# Patient Record
Sex: Female | Born: 1990 | Race: Black or African American | Hispanic: No | Marital: Single | Smoking: Current every day smoker
Health system: Southern US, Community
[De-identification: ages and names within clinical notes are randomized; demographics above are authoritative.]

## PROBLEM LIST (undated history)

## (undated) ENCOUNTER — Emergency Department: Discharge status: Absent without leave | Payer: Self-pay

## (undated) HISTORY — PX: ARTHROSCOPIC REPAIR ACL: SUR80

## (undated) HISTORY — PX: NOSE SURGERY: SHX723

---
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
## 2009-11-05 ENCOUNTER — Emergency Department: Payer: Self-pay | Admitting: Emergency Medicine

## 2010-06-25 ENCOUNTER — Ambulatory Visit: Payer: Self-pay

## 2010-08-04 ENCOUNTER — Ambulatory Visit: Payer: Self-pay | Admitting: Unknown Physician Specialty

## 2011-10-24 ENCOUNTER — Ambulatory Visit: Payer: Self-pay | Admitting: Family Medicine

## 2012-03-06 ENCOUNTER — Emergency Department: Payer: Self-pay | Admitting: Internal Medicine

## 2012-03-06 LAB — CBC
HCT: 40 % (ref 35.0–47.0)
HGB: 12.8 g/dL (ref 12.0–16.0)
MCV: 100 fL (ref 80–100)
Platelet: 204 10*3/uL (ref 150–440)
RDW: 12.2 % (ref 11.5–14.5)
WBC: 5 10*3/uL (ref 3.6–11.0)

## 2012-03-06 LAB — COMPREHENSIVE METABOLIC PANEL
Albumin: 4.1 g/dL (ref 3.4–5.0)
Alkaline Phosphatase: 73 U/L (ref 50–136)
Anion Gap: 5 — ABNORMAL LOW (ref 7–16)
Calcium, Total: 9.2 mg/dL (ref 8.5–10.1)
Co2: 25 mmol/L (ref 21–32)
Creatinine: 0.73 mg/dL (ref 0.60–1.30)
EGFR (Non-African Amer.): >60
Glucose: 78 mg/dL (ref 65–99)
Osmolality: 279 (ref 275–301)
SGPT (ALT): 18 U/L (ref 12–78)
Sodium: 141 mmol/L (ref 136–145)
Total Protein: 7.9 g/dL (ref 6.4–8.2)

## 2012-03-06 LAB — URINALYSIS, COMPLETE
Bacteria: NONE SEEN
Bilirubin,UR: NEGATIVE
Glucose,UR: NEGATIVE mg/dL (ref 0–75)
Ketone: NEGATIVE
Leukocyte Esterase: NEGATIVE
Nitrite: NEGATIVE
Ph: 6 (ref 4.5–8.0)
RBC,UR: 1 /HPF (ref 0–5)
Squamous Epithelial: 6

## 2012-03-06 LAB — LIPASE, BLOOD: Lipase: 61 U/L — ABNORMAL LOW (ref 73–393)

## 2012-09-21 ENCOUNTER — Ambulatory Visit: Payer: Self-pay | Admitting: Unknown Physician Specialty

## 2013-06-16 ENCOUNTER — Inpatient Hospital Stay: Payer: Self-pay | Admitting: Psychiatry

## 2013-06-16 LAB — DRUG SCREEN, URINE
AMPHETAMINES, UR SCREEN: NEGATIVE (ref ?–1000)
Barbiturates, Ur Screen: NEGATIVE (ref ?–200)
Benzodiazepine, Ur Scrn: POSITIVE (ref ?–200)
CANNABINOID 50 NG, UR ~~LOC~~: POSITIVE (ref ?–50)
Cocaine Metabolite,Ur ~~LOC~~: NEGATIVE (ref ?–300)
MDMA (Ecstasy)Ur Screen: NEGATIVE (ref ?–500)
METHADONE, UR SCREEN: NEGATIVE (ref ?–300)
OPIATE, UR SCREEN: NEGATIVE (ref ?–300)
Phencyclidine (PCP) Ur S: NEGATIVE (ref ?–25)
TRICYCLIC, UR SCREEN: NEGATIVE (ref ?–1000)

## 2013-06-16 LAB — CBC
HCT: 39.8 % (ref 35.0–47.0)
HGB: 12.7 g/dL (ref 12.0–16.0)
MCH: 32.9 pg (ref 26.0–34.0)
MCHC: 31.8 g/dL — AB (ref 32.0–36.0)
MCV: 103 fL — ABNORMAL HIGH (ref 80–100)
Platelet: 182 10*3/uL (ref 150–440)
RBC: 3.85 10*6/uL (ref 3.80–5.20)
RDW: 12.5 % (ref 11.5–14.5)
WBC: 12.3 10*3/uL — ABNORMAL HIGH (ref 3.6–11.0)

## 2013-06-16 LAB — URINALYSIS, COMPLETE
BACTERIA: NONE SEEN
Bilirubin,UR: NEGATIVE
Glucose,UR: NEGATIVE mg/dL (ref 0–75)
KETONE: NEGATIVE
Leukocyte Esterase: NEGATIVE
Nitrite: NEGATIVE
Ph: 5 (ref 4.5–8.0)
Protein: 100
RBC,UR: 13 /HPF (ref 0–5)
SPECIFIC GRAVITY: 1.015 (ref 1.003–1.030)
Squamous Epithelial: 2
WBC UR: 19 /HPF (ref 0–5)

## 2013-06-16 LAB — COMPREHENSIVE METABOLIC PANEL
ALK PHOS: 57 U/L
ALT: 44 U/L (ref 12–78)
AST: 80 U/L — AB (ref 15–37)
Albumin: 4.1 g/dL (ref 3.4–5.0)
Anion Gap: 8 (ref 7–16)
BILIRUBIN TOTAL: 0.6 mg/dL (ref 0.2–1.0)
BUN: 11 mg/dL (ref 7–18)
CREATININE: 0.85 mg/dL (ref 0.60–1.30)
Calcium, Total: 9.2 mg/dL (ref 8.5–10.1)
Chloride: 110 mmol/L — ABNORMAL HIGH (ref 98–107)
Co2: 24 mmol/L (ref 21–32)
Glucose: 67 mg/dL (ref 65–99)
Osmolality: 281 (ref 275–301)
Potassium: 3.6 mmol/L (ref 3.5–5.1)
SODIUM: 142 mmol/L (ref 136–145)
Total Protein: 7.7 g/dL (ref 6.4–8.2)

## 2013-06-16 LAB — SALICYLATE LEVEL: SALICYLATES, SERUM: 4 mg/dL — AB

## 2013-06-16 LAB — PREGNANCY, URINE: Pregnancy Test, Urine: NEGATIVE m[IU]/mL

## 2013-06-16 LAB — TSH: Thyroid Stimulating Horm: 0.82 u[IU]/mL

## 2013-06-16 LAB — ACETAMINOPHEN LEVEL: Acetaminophen: <2 ug/mL

## 2013-06-16 LAB — ETHANOL
ETHANOL %: 0.086 % — AB (ref 0.000–0.080)
Ethanol: 86 mg/dL
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
## 2014-05-03 NOTE — Consult Note (Signed)
PATIENT NAME:  Jeanette Garcia, Jeanette Garcia MR#:  [id number] DATE OF BIRTH:  14-Dec-1990  AGE: 24 years  SEX:  Female  RACE:  African-American  DATE OF DICTATION: 06/16/2013  [facility]ACE [facility] DI[facility]  [facility], [facility] [place]  DATE OF CONSULTATION:  06/16/2013  CONSULTING PHYSICIAN:  Chicquita Mendel K. Tomio Kirk, MD  SUBJECTIVE:  The patient [facility]. 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 The patient is not married, and has a girlfriend for the past 3 years. They do not live together. The patient reports that she has conflicts with people around, and anxiety related to the same. She admits that she has been having conflicts with suicidal ideas and wanting to hurt herself, and felt that she needed help.  OBJECTIVE:  The patient is seen lying in bed. Alert and oriented, competent and cooperative. Good historian. Affect is flat. Mood restricted. Admits feeling hopeless and helpless. Admits to being depressed and low. Does have suicidal wishes and wishes to hurt herself. Currently she contracts for safety, but she feels that she does need help. No psychosis. Cognition is intact. Knowledge of general information is fair. Insight and judgment guarded. Impulse control is poor, as patient does have violent outbursts and she gets out of control at times because she gets frustrated, and does not know how to deal with her frustration.  IMPRESSION:  Major depression with suicidal ideas, but contracts for safety. Rule out gender identity disorder. Recommend inpatient hospital psychiatry for closer observation, evaluation, and help as needed.   ____________________________ Jannet MantisSurya K. Guss Bundehalla, MD skc:mr D: 06/16/2013 18:07:45 ET T: 06/16/2013 20:38:16 ET JOB#: [id number]  cc: Monika SalkSurya K. Guss Bundehalla, MD, <Dictator> Beau FannySURYA K Shruthi Northrup MD ELECTRONICALLY SIGNED 06/17/2013 7:03

## 2017-06-03 ENCOUNTER — Other Ambulatory Visit: Admission: AD | Admit: 2017-06-03 | Admitting: Family Medicine

## 2018-07-07 ENCOUNTER — Emergency Department

## 2018-07-07 ENCOUNTER — Encounter: Payer: Self-pay | Admitting: Emergency Medicine

## 2018-07-07 ENCOUNTER — Other Ambulatory Visit: Payer: Self-pay

## 2018-07-07 ENCOUNTER — Emergency Department
Admission: EM | Admit: 2018-07-07 | Discharge: 2018-07-08 | Disposition: A | Discharge status: Home / Self Care | Attending: Emergency Medicine | Admitting: Emergency Medicine

## 2018-07-07 DIAGNOSIS — S0083XA Contusion of other part of head, initial encounter: Secondary | ICD-10-CM

## 2018-07-07 DIAGNOSIS — Y999 Unspecified external cause status: Secondary | ICD-10-CM | POA: Insufficient documentation

## 2018-07-07 DIAGNOSIS — Y939 Activity, unspecified: Secondary | ICD-10-CM | POA: Insufficient documentation

## 2018-07-07 DIAGNOSIS — R600 Localized edema: Secondary | ICD-10-CM | POA: Insufficient documentation

## 2018-07-07 DIAGNOSIS — F17200 Nicotine dependence, unspecified, uncomplicated: Secondary | ICD-10-CM | POA: Insufficient documentation

## 2018-07-07 DIAGNOSIS — S022XXA Fracture of nasal bones, initial encounter for closed fracture: Secondary | ICD-10-CM | POA: Insufficient documentation

## 2018-07-07 DIAGNOSIS — Z23 Encounter for immunization: Secondary | ICD-10-CM | POA: Insufficient documentation

## 2018-07-07 DIAGNOSIS — F10920 Alcohol use, unspecified with intoxication, uncomplicated: Secondary | ICD-10-CM | POA: Insufficient documentation

## 2018-07-07 DIAGNOSIS — Y929 Unspecified place or not applicable: Secondary | ICD-10-CM | POA: Insufficient documentation

## 2018-07-07 NOTE — ED Notes (Signed)
Patient transported to CT 
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
## 2018-07-08 MED ORDER — TETANUS-DIPHTH-ACELL PERTUSSIS 5-2.5-18.5 LF-MCG/0.5 IM SUSP
0.5000 mL | Freq: Once | INTRAMUSCULAR | Status: AC
  Administered 2018-07-08: 03:00:00 0.5 mL via INTRAMUSCULAR
  Filled 2018-07-08: qty 0.5

## 2018-07-08 NOTE — Discharge Instructions (Addendum)
Your CT scan shows a broken bone in your nose. You should follow up with ENT in one week for further evaluation of this injury.
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
## 2018-07-08 NOTE — ED Notes (Signed)
No peripheral IV placed this visit.   Discharge instructions reviewed with patient. Questions fielded by this RN. Patient verbalizes understanding of instructions. Patient discharged home in stable condition per stafford. No acute distress noted at time of discharge.   Pt's cousin, Teena Dunk, called to pick up pt.  Pt threatening to smoke in room if not DC'd.  Charge aware.  Pt shown to lobby and telephone.

## 2018-07-08 NOTE — ED Notes (Signed)
Anselm Jungling "Tart", called, can pick up pt, [phone]

## 2018-07-09 ENCOUNTER — Other Ambulatory Visit: Payer: Self-pay

## 2018-07-09 ENCOUNTER — Ambulatory Visit (INDEPENDENT_AMBULATORY_CARE_PROVIDER_SITE_OTHER): Payer: Self-pay

## 2018-07-09 ENCOUNTER — Ambulatory Visit
Admission: EM | Admit: 2018-07-09 | Discharge: 2018-07-09 | Disposition: A | Discharge status: Home / Self Care | Payer: Self-pay | Attending: Emergency Medicine | Admitting: Emergency Medicine

## 2018-07-09 DIAGNOSIS — M79671 Pain in right foot: Secondary | ICD-10-CM

## 2018-07-09 DIAGNOSIS — S0993XA Unspecified injury of face, initial encounter: Secondary | ICD-10-CM

## 2018-07-09 DIAGNOSIS — S0083XA Contusion of other part of head, initial encounter: Secondary | ICD-10-CM

## 2018-07-09 DIAGNOSIS — S022XXD Fracture of nasal bones, subsequent encounter for fracture with routine healing: Secondary | ICD-10-CM

## 2018-07-09 DIAGNOSIS — S9031XA Contusion of right foot, initial encounter: Secondary | ICD-10-CM

## 2018-07-09 DIAGNOSIS — S0992XA Unspecified injury of nose, initial encounter: Secondary | ICD-10-CM
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
## 2018-07-09 NOTE — Discharge Instructions (Signed)
Over-the-counter ibuprofen or Tylenol as needed.  Ice.  Rest.  Supportive care and monitoring.  Follow-up with ENT in 1 week as directed for follow-up.  Limit alcohol intake.  Follow up with your primary care physician this week as needed. Return to Urgent care for new or worsening concerns.
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
## 2018-08-06 ENCOUNTER — Other Ambulatory Visit: Payer: Self-pay

## 2018-08-06 ENCOUNTER — Emergency Department
Admission: EM | Admit: 2018-08-06 | Discharge: 2018-08-07 | Disposition: A | Discharge status: Home / Self Care | Payer: Self-pay | Attending: Emergency Medicine | Admitting: Emergency Medicine

## 2018-08-06 ENCOUNTER — Encounter: Payer: Self-pay | Admitting: Emergency Medicine

## 2018-08-06 DIAGNOSIS — F1094 Alcohol use, unspecified with alcohol-induced mood disorder: Secondary | ICD-10-CM | POA: Diagnosis present

## 2018-08-06 DIAGNOSIS — R451 Restlessness and agitation: Secondary | ICD-10-CM

## 2018-08-06 DIAGNOSIS — R45851 Suicidal ideations: Secondary | ICD-10-CM

## 2018-08-06 DIAGNOSIS — F419 Anxiety disorder, unspecified: Secondary | ICD-10-CM | POA: Insufficient documentation

## 2018-08-06 DIAGNOSIS — Z20828 Contact with and (suspected) exposure to other viral communicable diseases: Secondary | ICD-10-CM | POA: Insufficient documentation

## 2018-08-06 DIAGNOSIS — F10920 Alcohol use, unspecified with intoxication, uncomplicated: Secondary | ICD-10-CM

## 2018-08-06 DIAGNOSIS — F17210 Nicotine dependence, cigarettes, uncomplicated: Secondary | ICD-10-CM | POA: Insufficient documentation

## 2018-08-06 LAB — CBC WITH DIFFERENTIAL/PLATELET
Abs Immature Granulocytes: 0.01 10*3/uL (ref 0.00–0.07)
Basophils Absolute: 0 10*3/uL (ref 0.0–0.1)
Basophils Relative: 1 %
Eosinophils Absolute: 0 10*3/uL (ref 0.0–0.5)
Eosinophils Relative: 1 %
HCT: 41.7 % (ref 36.0–46.0)
Hemoglobin: 13.5 g/dL (ref 12.0–15.0)
Immature Granulocytes: 0 %
Lymphocytes Relative: 28 %
Lymphs Abs: 1.7 10*3/uL (ref 0.7–4.0)
MCH: 33.3 pg (ref 26.0–34.0)
MCHC: 32.4 g/dL (ref 30.0–36.0)
MCV: 103 fL — ABNORMAL HIGH (ref 80.0–100.0)
Monocytes Absolute: 0.3 10*3/uL (ref 0.1–1.0)
Monocytes Relative: 5 %
Neutro Abs: 3.8 10*3/uL (ref 1.7–7.7)
Neutrophils Relative %: 65 %
Platelets: 237 10*3/uL (ref 150–400)
RBC: 4.05 MIL/uL (ref 3.87–5.11)
RDW: 11.7 % (ref 11.5–15.5)
WBC: 5.9 10*3/uL (ref 4.0–10.5)
nRBC: 0 % (ref 0.0–0.2)

## 2018-08-06 LAB — ETHANOL: Alcohol, Ethyl (B): 197 mg/dL — ABNORMAL HIGH (ref <10)

## 2018-08-06 LAB — COMPREHENSIVE METABOLIC PANEL
ALT: 19 U/L (ref 0–44)
AST: 26 U/L (ref 15–41)
Albumin: 4.3 g/dL (ref 3.5–5.0)
Alkaline Phosphatase: 65 U/L (ref 38–126)
Anion gap: 12 (ref 5–15)
BUN: 10 mg/dL (ref 6–20)
CO2: 23 mmol/L (ref 22–32)
Calcium: 9.2 mg/dL (ref 8.9–10.3)
Chloride: 110 mmol/L (ref 98–111)
Creatinine, Ser: 0.76 mg/dL (ref 0.44–1.00)
GFR calc Af Amer: >60 mL/min (ref >60)
GFR calc non Af Amer: >60 mL/min (ref >60)
Glucose, Bld: 88 mg/dL (ref 70–99)
Potassium: 3.4 mmol/L — ABNORMAL LOW (ref 3.5–5.1)
Sodium: 145 mmol/L (ref 135–145)
Total Bilirubin: 0.6 mg/dL (ref 0.3–1.2)
Total Protein: 7.9 g/dL (ref 6.5–8.1)

## 2018-08-06 LAB — ACETAMINOPHEN LEVEL: Acetaminophen (Tylenol), Serum: <10 ug/mL — ABNORMAL LOW (ref 10–30)

## 2018-08-06 LAB — SALICYLATE LEVEL: Salicylate Lvl: <7 mg/dL (ref 2.8–30.0)

## 2018-08-06 MED ORDER — LORAZEPAM 2 MG PO TABS
2.0000 mg | ORAL_TABLET | Freq: Once | ORAL | Status: AC
  Administered 2018-08-06: 17:00:00 2 mg via ORAL
  Filled 2018-08-06: qty 1

## 2018-08-06 NOTE — ED Notes (Signed)
Patient currently laying in bed resting and appears to be asleep. Respirations even and non labored. Will continue to monitor.

## 2018-08-06 NOTE — ED Notes (Signed)
PT agitated, stating "I'm leaving" this RN informed pt of IVC paperwork and pt states " I don't care I'm about leave"

## 2018-08-06 NOTE — ED Notes (Signed)
Pt given sanitary pads for menstrual cycle per request
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
## 2018-08-06 NOTE — ED Notes (Signed)
Psych at bedside.
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
## 2018-08-06 NOTE — ED Notes (Signed)
IVC  SEEN  BY  NP  PENDING  PLACEMENT

## 2018-08-06 NOTE — ED Notes (Signed)
IVC PENDING  CONSULT ?

## 2018-08-06 NOTE — ED Notes (Signed)
Pt asking for medication for her agitation and since " I have to be here longer "

## 2018-08-06 NOTE — ED Notes (Signed)
Pt given dinner tray, does not want to eat at this time

## 2018-08-06 NOTE — ED Triage Notes (Signed)
Pt belongings: 1 yellow colored ring 2 yellow colored bracelets 1 yellow colored chain necklace  1 yellow colored necklace with number 3 and cross on it Pair of black flip flops Army green camouflage shorts  Brown belt Black tank top   Belonging bagged, tagged and given to [employer] for transport to pt ED room
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
## 2018-08-06 NOTE — ED Notes (Signed)
Pt was given the phone to use, pt made comments such as "security can't stop me," and "Im leaving."
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
## 2018-08-06 NOTE — ED Notes (Signed)
Pt using phone at this time.  

## 2018-08-07 DIAGNOSIS — F1094 Alcohol use, unspecified with alcohol-induced mood disorder: Secondary | ICD-10-CM | POA: Diagnosis present

## 2018-08-07 LAB — URINALYSIS, COMPLETE (UACMP) WITH MICROSCOPIC
Bacteria, UA: NONE SEEN
Bilirubin Urine: NEGATIVE
Glucose, UA: NEGATIVE mg/dL
Ketones, ur: 5 mg/dL — AB
Nitrite: NEGATIVE
Protein, ur: 30 mg/dL — AB
RBC / HPF: >50 RBC/hpf — ABNORMAL HIGH (ref 0–5)
Specific Gravity, Urine: 1.023 (ref 1.005–1.030)
pH: 8 (ref 5.0–8.0)

## 2018-08-07 LAB — URINE DRUG SCREEN, QUALITATIVE (ARMC ONLY)
Amphetamines, Ur Screen: NOT DETECTED
Barbiturates, Ur Screen: NOT DETECTED
Benzodiazepine, Ur Scrn: POSITIVE — AB
Cannabinoid 50 Ng, Ur ~~LOC~~: POSITIVE — AB
Cocaine Metabolite,Ur ~~LOC~~: NOT DETECTED
MDMA (Ecstasy)Ur Screen: NOT DETECTED
Methadone Scn, Ur: NOT DETECTED
Opiate, Ur Screen: NOT DETECTED
Phencyclidine (PCP) Ur S: NOT DETECTED
Tricyclic, Ur Screen: NOT DETECTED

## 2018-08-07 LAB — NOVEL CORONAVIRUS, NAA (HOSP ORDER, SEND-OUT TO REF LAB; TAT 18-24 HRS): SARS-CoV-2, NAA: NOT DETECTED

## 2018-08-07 LAB — PREGNANCY, URINE: Preg Test, Ur: NEGATIVE

## 2018-08-07 MED ORDER — NICOTINE 21 MG/24HR TD PT24
21.0000 mg | MEDICATED_PATCH | Freq: Once | TRANSDERMAL | Status: DC
  Administered 2018-08-07: 09:00:00 21 mg via TRANSDERMAL
  Filled 2018-08-07: qty 1

## 2018-08-07 MED ORDER — HYDROXYZINE HCL 25 MG PO TABS
50.0000 mg | ORAL_TABLET | Freq: Once | ORAL | Status: AC
  Administered 2018-08-07: 08:00:00 50 mg via ORAL
  Filled 2018-08-07: qty 2

## 2018-08-07 NOTE — ED Notes (Signed)
Pt has visitor at bedside. Screened for COVID at first nurse desk. Wanded in by Garment/textile technologist. Chaperoned by Solmon Ice, NT. Visit limited for 15 mins.

## 2018-08-07 NOTE — ED Notes (Signed)
Pt. Transferred to BHU from ED to room 2 after screening for contraband. Report to include Situation, Background, Assessment and Recommendations from Kim RN. Pt. Oriented to unit including Q15 minute rounds as well as the security cameras for their protection. Patient is alert and oriented, warm and dry in no acute distress. Patient denies SI, HI, and AVH. Pt. Encouraged to let me know if needs arise.  

## 2018-08-07 NOTE — ED Provider Notes (Signed)
The patient has been evaluated at bedside by Dr. Weber Cooks, psychiatry.  Patient is clinically stable.  Not felt to be a danger to self or others.  No SI or Hi.  No indication for inpatient psychiatric admission at this time.  Appropriate for continued outpatient therapy.     Merlyn Lot, MD 08/07/18 [phone]

## 2018-08-07 NOTE — ED Notes (Signed)
Hourly rounding reveals patient sleeping in room. No complaints, stable, in no acute distress. Q15 minute rounds and monitoring via Security Cameras to continue. 

## 2018-08-07 NOTE — ED Notes (Signed)
Meal tray given 

## 2018-08-07 NOTE — ED Notes (Addendum)
Father notifying sister, Caryl Pina, that patient is ready for pick up

## 2018-08-07 NOTE — ED Notes (Signed)
Pt up to restroom and was in there for around 30 mins and did not give a urine sample.

## 2018-08-07 NOTE — Consult Note (Signed)
[facility] Face-to-Face Psychiatry Consult   Reason for Consult:  Suicidal ideations while intoxicated Referring Physician:  EDP Patient Identification: Jeanette Garcia MRN:  [id number] Principal Diagnosis: Alcohol-induced mood disorder ([place]) Diagnosis:  Principal Problem:   Alcohol-induced mood disorder ([place])   Total Time spent with patient: 30 minutes  Subjective:   Jeanette Garcia is a 28 y.o. female patient reports that she was intoxicated yesterday when the text was sent to her father.  Patient states that she does not want to kill herself and she has no suicidal or homicidal ideations and denies any hallucinations.  Patient reports that she will be safe to discharge home and that her girlfriend, Wilber Bihari, will be staying with her when she leaves.  Contacted patient's father, Joneen Boers, was contacted for collateral information.  Patient's father states that he received a text and became concerned about her and sent the police over there to her house.  He states that now that she has sobered up he feels that she is safe to discharge home and that he is aware that her girlfriend is going to be staying with her and he feels safe with her staying with her girlfriend.  He states that he has no concerns at this time about the patient being discharged.  Contacted patient's girlfriend, [place].  Patient's girlfriend reports that yes she will be staying with her and feels that the patient only had the actions that she did yesterday due to her being intoxicated.  She states that she will be staying with her and that the patient's sister, Caryl Pina, will be picking her up from the hospital.  She states that she has no concerns with the patient being discharged and feels that she is safe for discharge.  Patient's girlfriend stated that she also visited her last night and feels that she has shown improvement with sobering up.  HPI:  Per Dr. Cinda Quest: 28 y.o.femalewho comes in under commitment. Commitment papers says she  is a known drug user who told her stepfather she was gone to kill herself. Police were called they found her with a belt around her neck. Patient says she is feeling fine and wants to go home. Patient also says some members of her family have a coronavirus and she wants to be tested for it.  Patient was seen by this provider face-to-face.  Patient presents much more calm, cooperative, and pleasant today.  Patient has remained in the [facility] without any incident.  There have been no complaints about the patient while she has been in [place].  Patient has continued to deny any suicidal homicidal ideations and is shown no aggressive behavior and does not appear to be responding to any internal or external stimuli.  When speaking with patient she does have logical conversation.  Based on collateral information gained patient is safe for discharge and there is a safety plan established.  IVC will be rescinded by Dr. Weber Cooks.  I have contacted Dr. Quentin Cornwall about this information.  At this time the patient does not meet inpatient criteria and is psychiatric cleared.  Past Psychiatric History: None  Risk to Self: Suicidal Ideation: No Suicidal Intent: No Is patient at risk for suicide?: No Suicidal Plan?: No Access to Means: No What has been your use of drugs/alcohol within the last 12 months?: UTA How many times?: 0 Other Self Harm Risks: None Reported Triggers for Past Attempts: None known Intentional Self Injurious Behavior: None Risk to Others: Homicidal Ideation: No Thoughts of Harm to Others: No  Current Homicidal Intent: No Current Homicidal Plan: No Access to Homicidal Means: No Identified Victim: None History of harm to others?: No Assessment of Violence: None Noted Violent Behavior Description: None Does patient have access to weapons?: No Criminal Charges Pending?: No Does patient have a court date: No Prior Inpatient Therapy: Prior Inpatient Therapy: No Prior Outpatient Therapy: Prior  Outpatient Therapy: No Does patient have an ACCT team?: No Does patient have Intensive In-House Services?  : No Does patient have [facility] services? : No Does patient have P4CC services?: No  Past Medical History: History reviewed. No pertinent past medical history.  Past Surgical History:  Procedure Laterality Date  . ARTHROSCOPIC REPAIR ACL Right   . NOSE SURGERY     Family History: No family history on file. Family Psychiatric  History: None reported Social History:  Social History   Substance and Sexual Activity  Alcohol Use Yes   Comment: occasionally     Social History   Substance and Sexual Activity  Drug Use Not Currently    Social History   Socioeconomic History  . Marital status: Single    Spouse name: Not on file  . Number of children: Not on file  . Years of education: Not on file  . Highest education level: Not on file  Occupational History  . Not on file  Social Needs  . Financial resource strain: Not on file  . Food insecurity    Worry: Not on file    Inability: Not on file  . Transportation needs    Medical: Not on file    Non-medical: Not on file  Tobacco Use  . Smoking status: Current Every Day Smoker    Packs/day: 0.50    Types: Cigarettes  . Smokeless tobacco: Never Used  Substance and Sexual Activity  . Alcohol use: Yes    Comment: occasionally  . Drug use: Not Currently  . Sexual activity: Not on file  Lifestyle  . Physical activity    Days per week: Not on file    Minutes per session: Not on file  . Stress: Not on file  Relationships  . Social Musicianconnections    Talks on phone: Not on file    Gets together: Not on file    Attends religious service: Not on file    Active member of club or organization: Not on file    Attends meetings of clubs or organizations: Not on file    Relationship status: Not on file  Other Topics Concern  . Not on file  Social History Narrative  . Not on file   Additional Social History:    Allergies:   No Known Allergies  Labs:  Results for orders placed or performed during the hospital encounter of 08/06/18 (from the past 48 hour(s))  Comprehensive metabolic panel     Status: Abnormal   Collection Time: 08/06/18  1:51 PM  Result Value Ref Range   Sodium 145 135 - 145 mmol/L   Potassium 3.4 (L) 3.5 - 5.1 mmol/L   Chloride 110 98 - 111 mmol/L   CO2 23 22 - 32 mmol/L   Glucose, Bld 88 70 - 99 mg/dL   BUN 10 6 - 20 mg/dL   Creatinine, Ser 1.610.76 0.44 - 1.00 mg/dL   Calcium 9.2 8.9 - 09.610.3 mg/dL   Total Protein 7.9 6.5 - 8.1 g/dL   Albumin 4.3 3.5 - 5.0 g/dL   AST 26 15 - 41 U/L   ALT 19 0 - 44  U/L   Alkaline Phosphatase 65 38 - 126 U/L   Total Bilirubin 0.6 0.3 - 1.2 mg/dL   GFR calc non Af Amer >60 >60 mL/min   GFR calc Af Amer >60 >60 mL/min   Anion gap 12 5 - 15    Comment: Performed at [facility], [address] [place], [place] [address]  Acetaminophen level     Status: Abnormal   Collection Time: 08/06/18  1:51 PM  Result Value Ref Range   Acetaminophen (Tylenol), Serum <10 (L) 10 - 30 ug/mL    Comment: (NOTE) Therapeutic concentrations vary significantly. A range of 10-30 ug/mL  may be an effective concentration for many patients. However, some  are best treated at concentrations outside of this range. Acetaminophen concentrations >150 ug/mL at 4 hours after ingestion  and >50 ug/mL at 12 hours after ingestion are often associated with  toxic reactions. Performed at [facility], [address] [place], [place] [address]   Ethanol     Status: Abnormal   Collection Time: 08/06/18  1:51 PM  Result Value Ref Range   Alcohol, Ethyl (B) 197 (H) <10 mg/dL    Comment: (NOTE) Lowest detectable limit for serum alcohol is 10 mg/dL. For medical purposes only. Performed at [facility], [address] [place], [place] [address]   Salicylate level     Status: None   Collection Time: 08/06/18  1:51 PM  Result Value Ref Range   Salicylate  Lvl <7.0 2.8 - 30.0 mg/dL    Comment: Performed at [facility], [address] [place], [place] [address]  CBC with Differential     Status: Abnormal   Collection Time: 08/06/18  1:51 PM  Result Value Ref Range   WBC 5.9 4.0 - 10.5 K/uL   RBC 4.05 3.87 - 5.11 MIL/uL   Hemoglobin 13.5 12.0 - 15.0 g/dL   HCT 29.541.7 62.136.0 - 30.846.0 %   MCV 103.0 (H) 80.0 - 100.0 fL   MCH 33.3 26.0 - 34.0 pg   MCHC 32.4 30.0 - 36.0 g/dL   RDW 65.711.7 84.611.5 - 96.215.5 %   Platelets 237 150 - 400 K/uL   nRBC 0.0 0.0 - 0.2 %   Neutrophils Relative % 65 %   Neutro Abs 3.8 1.7 - 7.7 K/uL   Lymphocytes Relative 28 %   Lymphs Abs 1.7 0.7 - 4.0 K/uL   Monocytes Relative 5 %   Monocytes Absolute 0.3 0.1 - 1.0 K/uL   Eosinophils Relative 1 %   Eosinophils Absolute 0.0 0.0 - 0.5 K/uL   Basophils Relative 1 %   Basophils Absolute 0.0 0.0 - 0.1 K/uL   Immature Granulocytes 0 %   Abs Immature Granulocytes 0.01 0.00 - 0.07 K/uL    Comment: Performed at [facility], [address] [place], [place] [address]  Urinalysis, Complete w Microscopic     Status: Abnormal   Collection Time: 08/06/18  2:52 PM  Result Value Ref Range   Color, Urine YELLOW (A) YELLOW   APPearance HAZY (A) CLEAR   Specific Gravity, Urine 1.023 1.005 - 1.030   pH 8.0 5.0 - 8.0   Glucose, UA NEGATIVE NEGATIVE mg/dL   Hgb urine dipstick LARGE (A) NEGATIVE   Bilirubin Urine NEGATIVE NEGATIVE   Ketones, ur 5 (A) NEGATIVE mg/dL   Protein, ur 30 (A) NEGATIVE mg/dL   Nitrite NEGATIVE NEGATIVE   Leukocytes,Ua SMALL (A) NEGATIVE   RBC / HPF >50 (H) 0 - 5  RBC/hpf   WBC, UA 21-50 0 - 5 WBC/hpf   Bacteria, UA NONE SEEN NONE SEEN   Squamous Epithelial / LPF 0-5 0 - 5   Mucus PRESENT     Comment: Performed at [facility], [address] [place], [place] [address]  Pregnancy, urine     Status: None   Collection Time: 08/06/18  2:52 PM  Result Value Ref Range   Preg Test, Ur NEGATIVE NEGATIVE    Comment: Performed at [facility], [address], [place], [place] [address]  Urine Drug Screen, Qualitative     Status: Abnormal   Collection Time: 08/06/18  2:52 PM  Result Value Ref Range   Tricyclic, Ur Screen NONE DETECTED NONE DETECTED   Amphetamines, Ur Screen NONE DETECTED NONE DETECTED   MDMA (Ecstasy)Ur Screen NONE DETECTED NONE DETECTED   Cocaine Metabolite,Ur [place] NONE DETECTED NONE DETECTED   Opiate, Ur Screen NONE DETECTED NONE DETECTED   Phencyclidine (PCP) Ur S NONE DETECTED NONE DETECTED   Cannabinoid 50 Ng, Ur [place] POSITIVE (A) NONE DETECTED   Barbiturates, Ur Screen NONE DETECTED NONE DETECTED   Benzodiazepine, Ur Scrn POSITIVE (A) NONE DETECTED   Methadone Scn, Ur NONE DETECTED NONE DETECTED    Comment: (NOTE) Tricyclics + metabolites, urine    Cutoff 1000 ng/mL Amphetamines + metabolites, urine  Cutoff 1000 ng/mL MDMA (Ecstasy), urine              Cutoff 500 ng/mL Cocaine Metabolite, urine          Cutoff 300 ng/mL Opiate + metabolites, urine        Cutoff 300 ng/mL Phencyclidine (PCP), urine         Cutoff 25 ng/mL Cannabinoid, urine                 Cutoff 50 ng/mL Barbiturates + metabolites, urine  Cutoff 200 ng/mL Benzodiazepine, urine              Cutoff 200 ng/mL Methadone, urine                   Cutoff 300 ng/mL The urine drug screen provides only a preliminary, unconfirmed analytical test result and should not be used for non-medical purposes. Clinical consideration and professional judgment should be applied to any positive drug screen result due to possible interfering substances. A more specific alternate chemical method must be used in order to obtain a confirmed analytical result. Gas chromatography / mass spectrometry (GC/MS) is the preferred confirmat ory method. Performed at [facility], [address] [place], [place] [address]   Novel Coronavirus,NAA,(SEND-OUT TO REF LAB - TAT 24-48 hrs); Hosp Order     Status: None   Collection Time: 08/06/18  2:52  PM   Specimen: Nasopharyngeal Swab; Respiratory  Result Value Ref Range   SARS-CoV-2, NAA NOT DETECTED NOT DETECTED    Comment: (NOTE) This test was developed and its performance characteristics determined by [employer]. This test has not been FDA cleared or approved. This test has been authorized by FDA under an Emergency Use Authorization (EUA). This test is only authorized for the duration of time the declaration that circumstances exist justifying the authorization of the emergency use of in vitro diagnostic tests for detection of SARS-CoV-2 virus and/or diagnosis of COVID-19 infection under section 564(b)(1) of the Act, 21 U.S.C. 914NWG-9(F)(6), unless the authorization is terminated or revoked sooner. When diagnostic testing is negative, the possibility of a false negative result should  be considered in the context of a patient's recent exposures and the presence of clinical signs and symptoms consistent with COVID-19. An individual without symptoms of COVID-19 and who is not shedding SARS-CoV-2 virus would expect to have a negative (not detected) result in this assay. Performed  At: [facility] [address] [place], [place] [id number] Jolene SchimkeNagendra Sanjai MD YQ:[id number]:[phone]    Coronavirus Source NASOPHARYNGEAL     Comment: Performed at [facility], [address] [place], [place] [address]    Current Facility-Administered Medications  Medication Dose Route Frequency Provider Last Rate Last Dose  . nicotine (NICODERM CQ - dosed in mg/24 hours) patch 21 mg  21 mg Transdermal Once Minna AntisPaduchowski, Kevin, MD   21 mg at 08/07/18 0830   No current outpatient medications on file.    Musculoskeletal: Strength & Muscle Tone: within normal limits Gait & Station: normal Patient leans: N/A  Psychiatric Specialty Exam: Physical Exam  Nursing note and vitals reviewed. Constitutional: She is oriented to person, place, and time. She appears well-developed and  well-nourished.  Cardiovascular: Normal rate.  Respiratory: Effort normal.  Musculoskeletal: Normal range of motion.  Neurological: She is alert and oriented to person, place, and time.    Review of Systems  Constitutional: Negative.   HENT: Negative.   Eyes: Negative.   Respiratory: Negative.   Cardiovascular: Negative.   Gastrointestinal: Negative.   Genitourinary: Negative.   Musculoskeletal: Negative.   Skin: Negative.   Neurological: Negative.   Endo/Heme/Allergies: Negative.   Psychiatric/Behavioral: Positive for substance abuse.    Blood pressure (!) 115/92, pulse 64, temperature 98.5 F (36.9 C), temperature source Oral, resp. rate 17, height 5\' 9"  (1.753 m), weight 54.7 kg, last menstrual period 08/06/2018, SpO2 100 %.Body mass index is 17.82 kg/m.  General Appearance: Casual  Eye Contact:  Good  Speech:  Clear and Coherent and Normal Rate  Volume:  Normal  Mood:  Euthymic  Affect:  Congruent  Thought Process:  Coherent and Descriptions of Associations: Intact  Orientation:  Full (Time, Place, and Person)  Thought Content:  WDL  Suicidal Thoughts:  No  Homicidal Thoughts:  No  Memory:  Immediate;   Good Recent;   Good Remote;   Good  Judgement:  Good  Insight:  Good  Psychomotor Activity:  Normal  Concentration:  Concentration: Good  Recall:  Good  Fund of Knowledge:  Good  Language:  Good  Akathisia:  No  Handed:  Right  AIMS (if indicated):     Assets:  Communication Skills Desire for Improvement Housing Physical Health Social Support Transportation  ADL's:  Intact  Cognition:  WNL  Sleep:        Treatment Plan Summary: Provide resources for substance abuse treatment  Disposition: No evidence of imminent risk to self or others at present.   Patient does not meet criteria for psychiatric inpatient admission. Supportive therapy provided about ongoing stressors. Discussed crisis plan, support from social network, calling 911, coming to the  Emergency Department, and calling Suicide Hotline.  Gerlene Burdockravis B Davinder Haff, FNP 08/07/2018 4:23 PM

## 2018-08-07 NOTE — ED Notes (Signed)
Patient in shower at this time. 

## 2018-08-07 NOTE — ED Notes (Signed)
Pt signed paper copy of d/c. Verbalizes understanding of all paperwork. AOx4. Cooperative and pleasant,

## 2018-08-07 NOTE — ED Notes (Signed)
Darnelle Maffucci, NP at bedside for reeval.

## 2020-10-17 IMAGING — CT CT CERVICAL SPINE WITHOUT CONTRAST
3 of 20 series · 5 of 33 positions shown, 6 images · non-contrast
Comparison: Head CT dated 10/24/2011

CLINICAL DATA: 28-year-old female with head trauma.

EXAM:
CT HEAD WITHOUT CONTRAST
CT MAXILLOFACIAL WITHOUT CONTRAST
CT CERVICAL SPINE WITHOUT CONTRAST
TECHNIQUE: Multidetector CT imaging of the head, cervical spine, and
maxillofacial structures were performed using the standard protocol
without intravenous contrast. Multiplanar CT image reconstructions
of the cervical spine and maxillofacial structures were also
generated.

[Series 17: coronal bone · coronal · 0.22mm/px · 1 of 47 slices shown]
[im 34/47  bone]
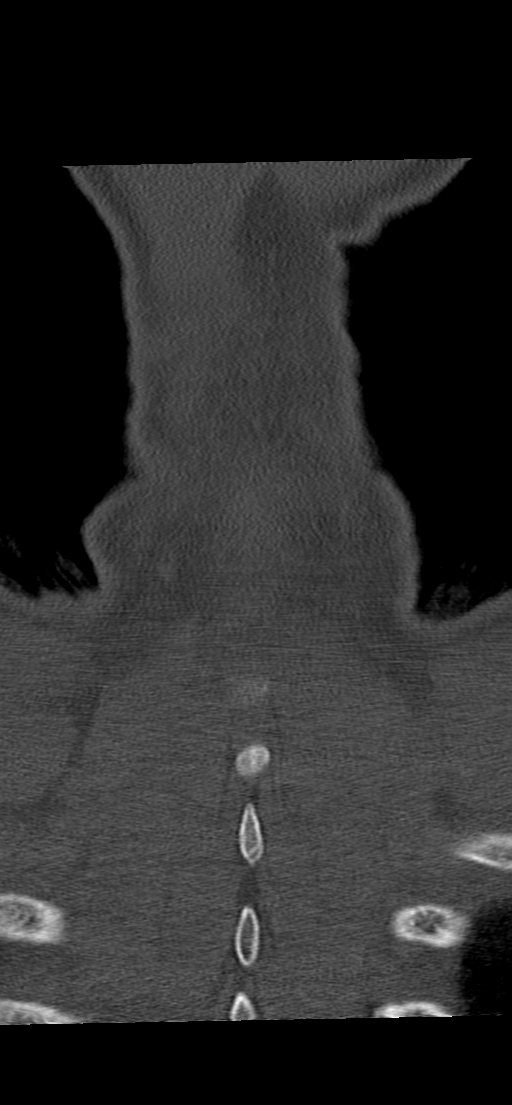

[Series 18: orthogonal axials · axial · 0.18mm/px · z∈[-301,-88]mm · 3 of 115 slices shown, 4 images]
[im 1/115  soft-tissue]
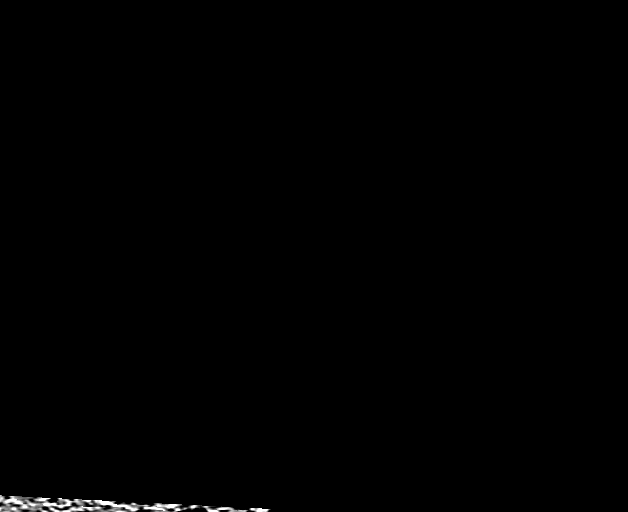
[im 1/115  bone]
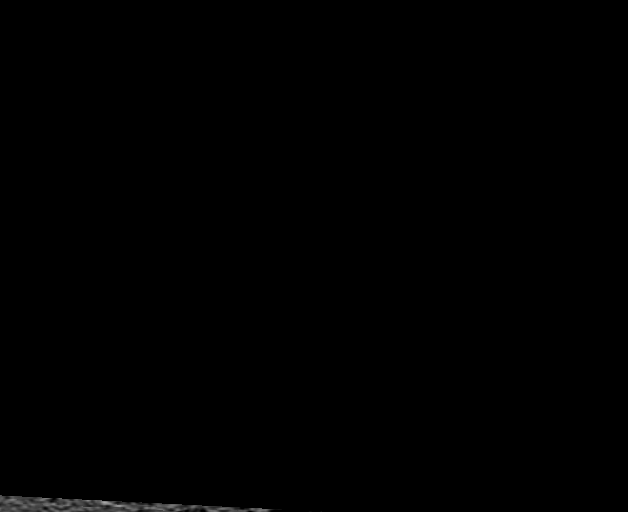
[im 58/115  bone]
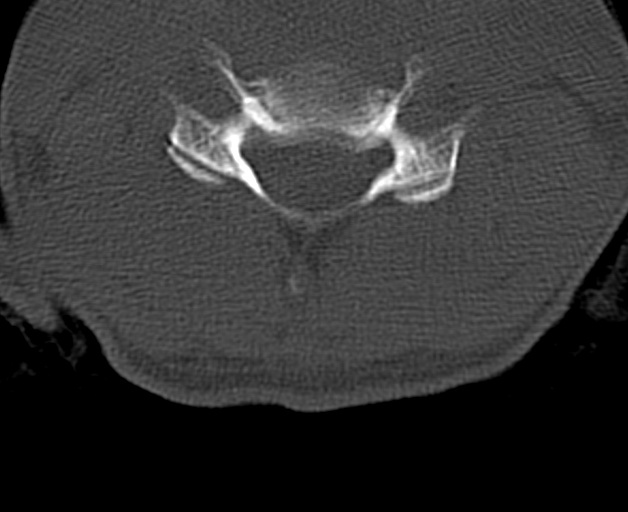
[im 115/115  bone]
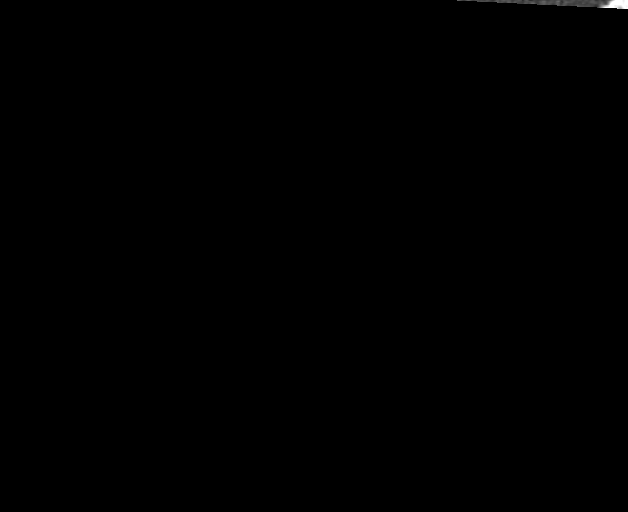

[Series 20: sagittal soft · sagittal · 0.29mm/px · 1 of 78 slices shown]
[im 39/78  bone]
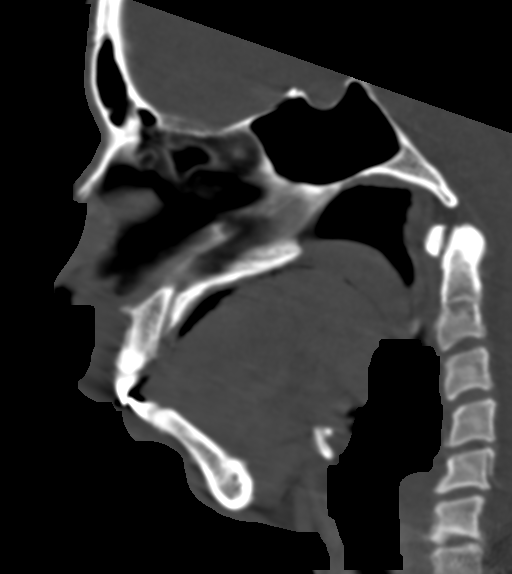

[5 of 33 positions shown; findings below may reference images not displayed]

FINDINGS: Evaluation of this exam is limited due to motion artifact.

CT HEAD FINDINGS

Brain: No evidence of acute infarction, hemorrhage, hydrocephalus,
extra-axial collection or mass lesion/mass effect.

Vascular: No hyperdense vessel or unexpected calcification.

Skull: Normal. Negative for fracture or focal lesion.

Other: Mild scalp contusion over the right parietal vertex.

CT MAXILLOFACIAL FINDINGS

Osseous: Mildly depressed fracture of the right nasal bone. No
definite other acute fracture identified. Apparent focal area of
discontinuity of the right orbital floor (series 37 image 19) is not
identified on the other views, likely artifactual. Clinical
correlation is recommended. No mandibular subluxation.

Orbits: Negative. No traumatic or inflammatory finding.

Sinuses: Clear.

Soft tissues: Mild soft tissue swelling over the nose.

CT CERVICAL SPINE FINDINGS

Alignment: No acute subluxation.

Skull base and vertebrae: No acute fracture.

Soft tissues and spinal canal: No prevertebral fluid or swelling. No
visible canal hematoma.

Disc levels: No significant degenerative changes. No acute findings.

Upper chest: Small subpleural blebs.

Other: None
IMPRESSION: 1. No acute intracranial pathology.
2. No acute/traumatic cervical spine pathology.
3. Mildly depressed fracture of the right nasal bone.

## 2020-10-19 IMAGING — CR RIGHT FOOT COMPLETE - 3+ VIEW
3 series · 3 of 3 positions shown · non-contrast
Comparison: None.

CLINICAL DATA: Injury

EXAM:
RIGHT FOOT COMPLETE - 3+ VIEW

[foot ap]
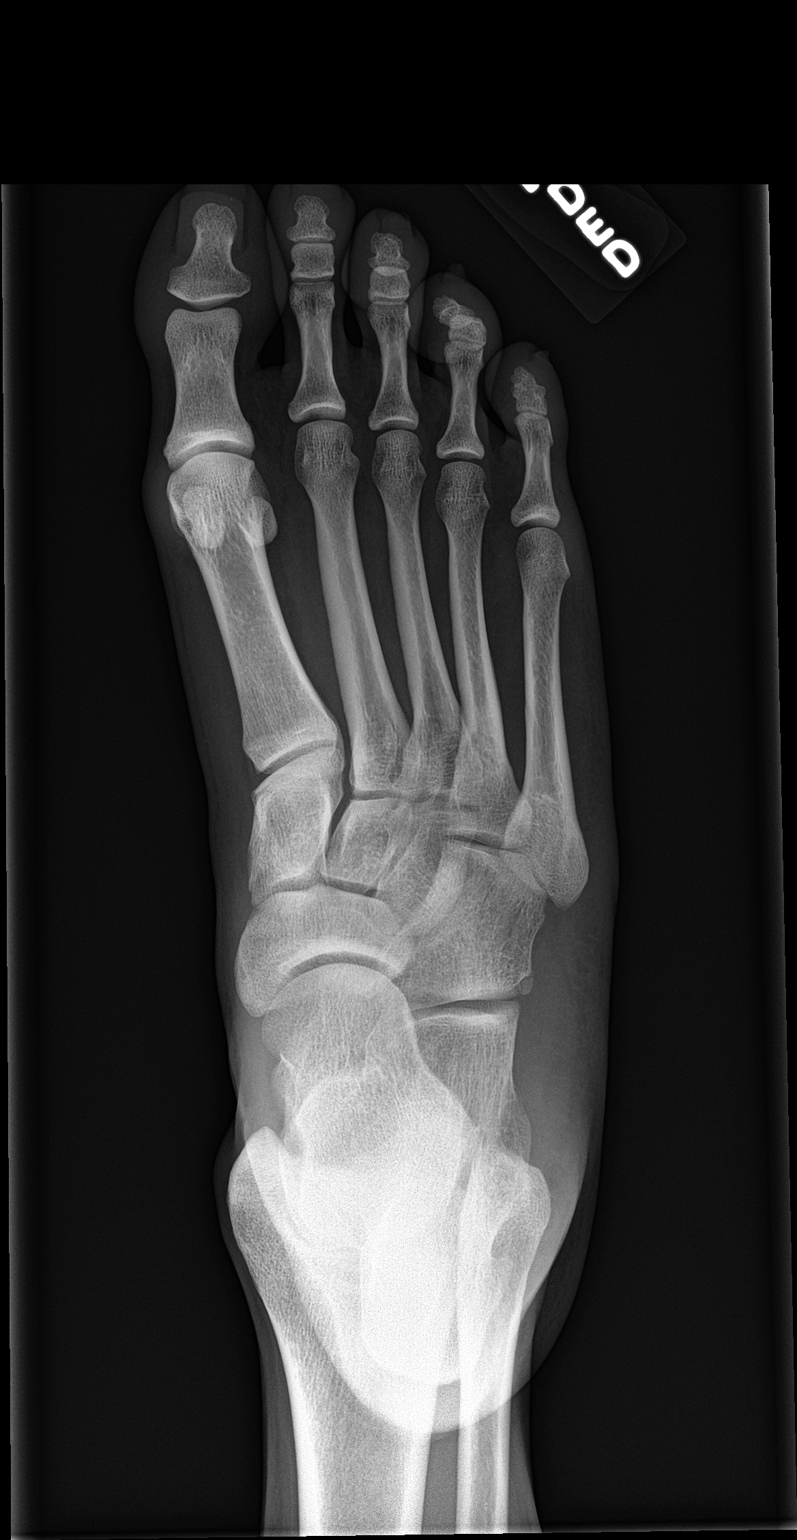

[foot obl]
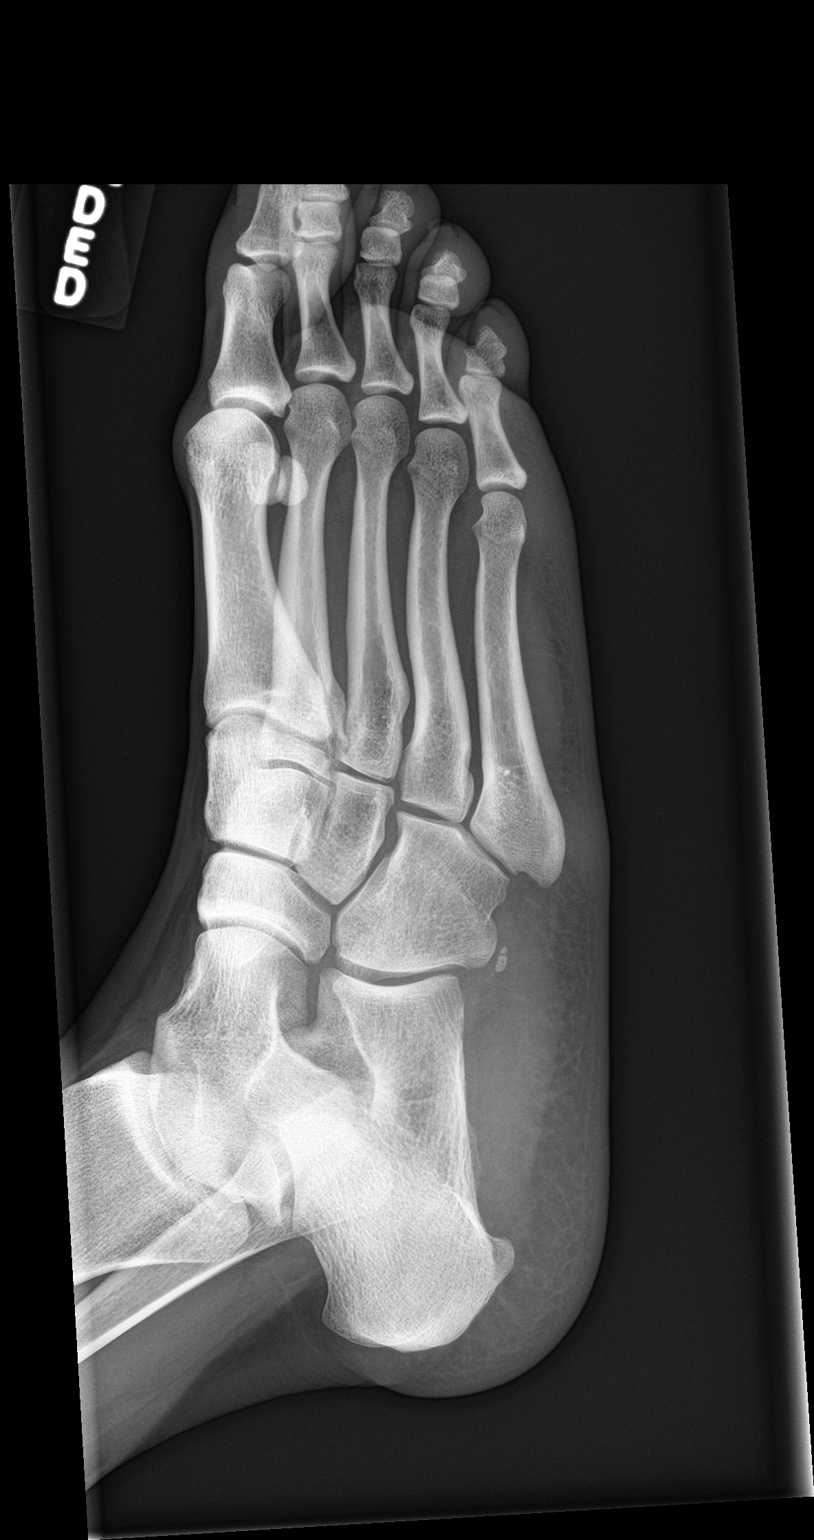

[foot lat]
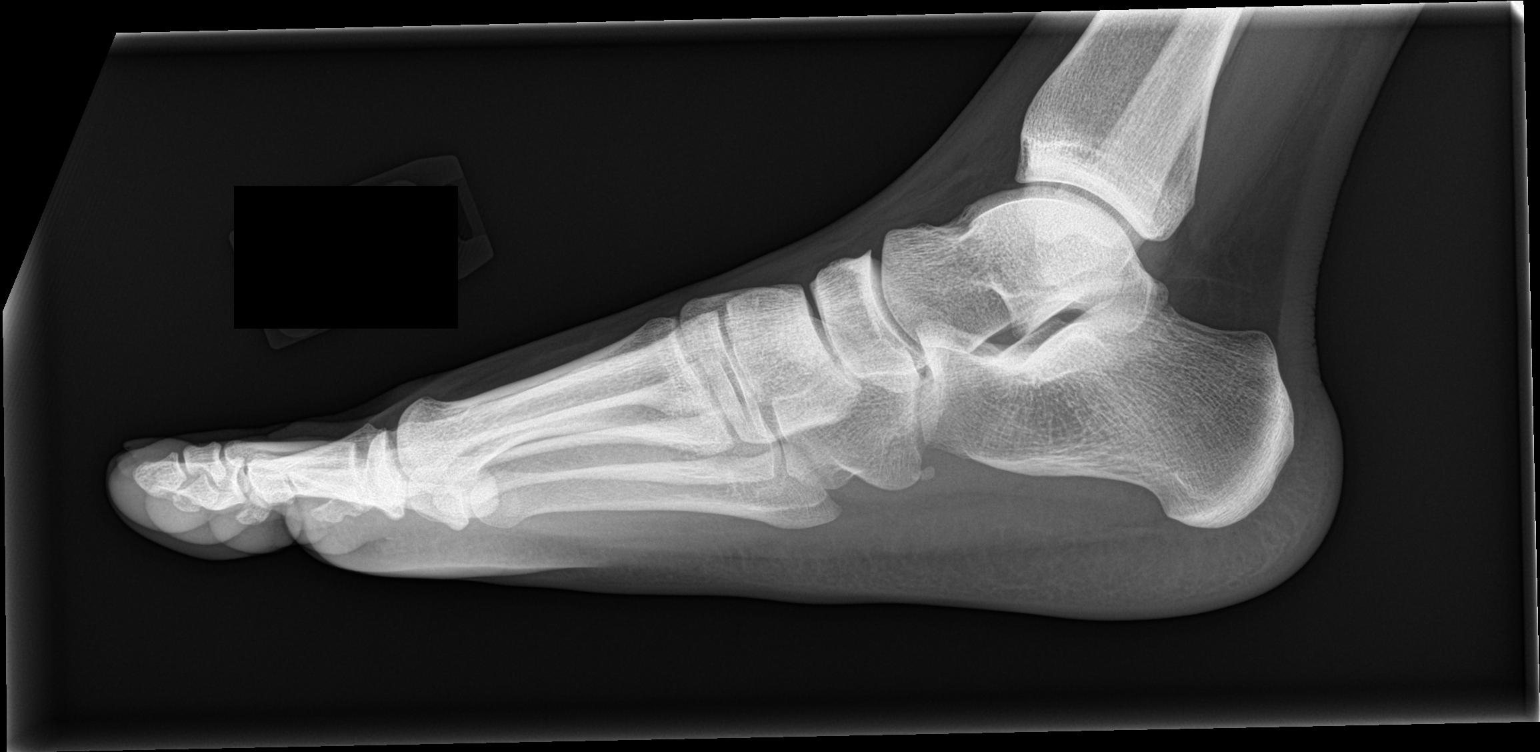

[3 of 3 positions shown; findings below may reference images not displayed]

FINDINGS: There is no evidence of fracture or dislocation. There is no
evidence of arthropathy or other focal bone abnormality. Soft
tissues are unremarkable.
IMPRESSION: Negative.
